# Patient Record
Sex: Male | Born: 1970 | Race: White | Hispanic: No | Smoking: Never smoker
Health system: Southern US, Community
[De-identification: ages and names within clinical notes are randomized; demographics above are authoritative.]

## PROBLEM LIST (undated history)

## (undated) DIAGNOSIS — I10 Essential (primary) hypertension: Secondary | ICD-10-CM

## (undated) DIAGNOSIS — E669 Obesity, unspecified: Secondary | ICD-10-CM

## (undated) DIAGNOSIS — R7989 Other specified abnormal findings of blood chemistry: Secondary | ICD-10-CM

## (undated) HISTORY — PX: KNEE SURGERY: SHX244

---

## 2015-02-22 ENCOUNTER — Emergency Department
Admission: EM | Admit: 2015-02-22 | Discharge: 2015-02-22 | Disposition: A | Discharge status: Home / Self Care | Payer: PRIVATE HEALTH INSURANCE | Source: Home / Self Care | Attending: Family Medicine | Admitting: Family Medicine

## 2015-02-22 ENCOUNTER — Encounter: Payer: Self-pay | Admitting: Emergency Medicine

## 2015-02-22 DIAGNOSIS — K649 Unspecified hemorrhoids: Secondary | ICD-10-CM | POA: Diagnosis not present

## 2015-02-22 HISTORY — DX: Other specified abnormal findings of blood chemistry: R79.89

## 2015-02-22 HISTORY — DX: Obesity, unspecified: E66.9

## 2015-02-22 HISTORY — DX: Essential (primary) hypertension: I10

## 2015-02-22 MED ORDER — BENZOCAINE (TOPICAL) 10 % EX OINT: TOPICAL_OINTMENT | CUTANEOUS | Status: AC

## 2015-02-22 MED ORDER — PREPARATION H 50 % EX PADS: MEDICATED_PAD | CUTANEOUS | Status: AC

## 2015-02-22 NOTE — ED Provider Notes (Signed)
CSN: [id number]     Arrival date & time 02/22/15  9147 History   First MD Initiated Contact with Patient 02/22/15 [phone]     Chief Complaint  Patient presents with  . Hemorrhoids   (Consider location/radiation/quality/duration/timing/severity/associated sxs/prior Treatment) HPI Pt is a 44yo male presenting to [facility] with c/o worsening hemorrhoids for 6 days. He has been using preparation H and increasing his daily fiber to help with bowel movements but no relief.  He denies fever, chills, n/v/d. Denies difficulty having a bowel movement until yesterday he states he did have mild constipation. Denies urinary symptoms. Denies abdominal pain. Denies prior hx of hemorrhoids. He does have a PCP.   Past Medical History  Diagnosis Date  . Hypertension   . Obesity   . Low testosterone    Past Surgical History  Procedure Laterality Date  . Knee surgery     Family History  Problem Relation Age of Onset  . Hypertension Mother    Social History  Substance Use Topics  . Smoking status: Never Smoker   . Smokeless tobacco: None  . Alcohol Use: No    Review of Systems  Constitutional: Negative for fever and chills.  Gastrointestinal: Positive for constipation, anal bleeding and rectal pain. Negative for nausea, vomiting, abdominal pain, diarrhea and blood in stool.  Genitourinary: Negative for dysuria, frequency, hematuria and flank pain.  Musculoskeletal: Negative for myalgias and arthralgias.  Skin: Positive for wound. Negative for color change.    Allergies  Review of patient's allergies indicates not on file.  Home Medications   Prior to Admission medications   Medication Sig Start Date End Date Taking? Authorizing Provider  amLODipine-benazepril (LOTREL) 10-20 MG capsule Take 1 capsule by mouth daily.   Yes Historical Provider, MD  lisinopril-hydrochlorothiazide (PRINZIDE,ZESTORETIC) 10-12.5 MG tablet Take 1 tablet by mouth daily.   Yes Historical Provider, MD  nebivolol (BYSTOLIC)  10 MG tablet Take 10 mg by mouth daily.   Yes Historical Provider, MD  testosterone (ANDROGEL) 50 MG/5GM (1%) GEL Place 5 g onto the skin daily.   Yes Historical Provider, MD  BENZOCAINE, TOPICAL, 10 % OINT Apply up to 6 times daily to help with pain, discomfort 02/22/15   Junius Finner, PA-C  Witch Hazel (PREPARATION H) 50 % PADS Use up to 6 times a day or after each bowel movement as needed 02/22/15   Junius Finner, PA-C   Meds Ordered and Administered this Visit  Medications - No data to display  BP 154/87 mmHg  Pulse 67  Temp(Src) 97.6 F (36.4 C) (Oral)  Wt 480 lb (217.727 kg)  SpO2 95% No data found.   Physical Exam  Constitutional: He is oriented to person, place, and time. He appears well-developed and well-nourished.  HENT:  Head: Normocephalic and atraumatic.  Eyes: EOM are normal.  Neck: Normal range of motion.  Cardiovascular: Normal rate.   Pulmonary/Chest: Effort normal.  Genitourinary: Rectal exam shows external hemorrhoid and tenderness. Rectal exam shows anal tone normal.     Chaperoned exam. External hemorrhoids- one 1cm, scant red blood. One 3cm hemorrhoid, no erythema or discoloration of skin. Tender to touch.  Well healed scar, non-tender. C/w prior pilonidal cyst. No erythema or fluctuance.  Musculoskeletal: Normal range of motion.  Neurological: He is alert and oriented to person, place, and time.  Skin: Skin is warm and dry.  Psychiatric: He has a normal mood and affect. His behavior is normal.  Nursing note and vitals reviewed.   ED Course  Procedures (  including critical care time)  Labs Review Labs Reviewed - No data to display  Imaging Review No results found.    MDM   1. Hemorrhoids, unspecified hemorrhoid type     Pt is a 44yo male presenting to [facility] with c/o worsening external hemorrhoids.  Pain with bowel movements, scant red blood on stool but no other symptoms. Exam c/w large external hemorrhoids w/o evidence of underlying  cellulitis as this time. No evidence of pilonidal abscess. Due to size of hemorrhoids, advised pt he would likely need a general surgery consultation for further treatment options.  Encouraged to continue conservative treatment in the meantime. Rx: witch hazel pads and benzocaine topical ointment.  [facility] but also advised pt, referral to a general surgeon may need to come from his PCP. Patient verbalized understanding and agreement with treatment plan.    Junius Finnerrin O'Malley, PA-C 02/22/15 1017

## 2015-02-22 NOTE — Discharge Instructions (Signed)
You may continue to use over the counter fiber supplements such as Metamucil, Citrucel, or Benefiber to help with having easier bowel movements.  See below for further instructions.   Disposable Sitz Bath A disposable sitz bath is a plastic basin that fits over the toilet. A bag is hung above the toilet and is connected to a tube that opens into the disposable sitz bath. The bag is filled with warm water that can flow into the basin through the tube.  HOW TO USE A DISPOSABLE SITZ BATH  Close the clamp on the tubing before filling the bag with water. This is to prevent leakage.  Fill the sitz bath basin and the plastic bag with warm water.  Place the filled basin on the toilet with the seat raised. Make sure the overflow opening is facing toward the back of the toilet.  Hang the filled plastic bag overhead on a hook or towel rack close to the toilet. When the bag is unclamped, a steady stream of water will flow from the bag, through the tubing, and into the basin.  Attach the tubing to the opening on the basin.  Sit on the basin positioned on the toilet seat and release the clamp. This will allow warm water to flush the area around your genitals and anus (perineum).  Remain sitting on the basin for approximately 15 to 20 minutes.  Stand up and pat the perineum area dry. If needed, apply clean bandages (dressings) to the affected area.  Tip the basin into the toilet to remove any remaining water and flush the toilet.  Wash the basin with warm water and soap. Let it dry in the sink.  Store the basin and tubing in a clean, dry area.  Wash your hands with soap and water. SEEK MEDICAL CARE IF: You get worse instead of better. Stop the sitz baths if you get worse. MAKE SURE YOU:  Understand these instructions.  Will watch your condition.  Will get help right away if you are not doing well or get worse.   This information is not intended to replace advice given to you by your health  care provider. Make sure you discuss any questions you have with your health care provider.   Document Released: 09/21/2011 Document Revised: 12/15/2011 Document Reviewed: 09/21/2011 Elsevier Interactive Patient Education 2016 [employer]  Hemorrhoids Hemorrhoids are swollen veins around the rectum or anus. There are two types of hemorrhoids:   Internal hemorrhoids. These occur in the veins just inside the rectum. They may poke through to the outside and become irritated and painful.  External hemorrhoids. These occur in the veins outside the anus and can be felt as a painful swelling or hard lump near the anus. CAUSES  Pregnancy.   Obesity.   Constipation or diarrhea.   Straining to have a bowel movement.   Sitting for long periods on the toilet.  Heavy lifting or other activity that caused you to strain.  Anal intercourse. SYMPTOMS   Pain.   Anal itching or irritation.   Rectal bleeding.   Fecal leakage.   Anal swelling.   One or more lumps around the anus.  DIAGNOSIS  Your caregiver may be able to diagnose hemorrhoids by visual examination. Other examinations or tests that may be performed include:   Examination of the rectal area with a gloved hand (digital rectal exam).   Examination of anal canal using a small tube (scope).   A blood test if you have lost a significant  amount of blood.  A test to look inside the colon (sigmoidoscopy or colonoscopy). TREATMENT Most hemorrhoids can be treated at home. However, if symptoms do not seem to be getting better or if you have a lot of rectal bleeding, your caregiver may perform a procedure to help make the hemorrhoids get smaller or remove them completely. Possible treatments include:   Placing a rubber band at the base of the hemorrhoid to cut off the circulation (rubber band ligation).   Injecting a chemical to shrink the hemorrhoid (sclerotherapy).   Using a tool to burn the hemorrhoid  (infrared light therapy).   Surgically removing the hemorrhoid (hemorrhoidectomy).   Stapling the hemorrhoid to block blood flow to the tissue (hemorrhoid stapling).  HOME CARE INSTRUCTIONS   Eat foods with fiber, such as whole grains, beans, nuts, fruits, and vegetables. Ask your doctor about taking products with added fiber in them (fibersupplements).  Increase fluid intake. Drink enough water and fluids to keep your urine clear or pale yellow.   Exercise regularly.   Go to the bathroom when you have the urge to have a bowel movement. Do not wait.   Avoid straining to have bowel movements.   Keep the anal area dry and clean. Use wet toilet paper or moist towelettes after a bowel movement.   Medicated creams and suppositories may be used or applied as directed.   Only take over-the-counter or prescription medicines as directed by your caregiver.   Take warm sitz baths for 15-20 minutes, 3-4 times a day to ease pain and discomfort.   Place ice packs on the hemorrhoids if they are tender and swollen. Using ice packs between sitz baths may be helpful.   Put ice in a plastic bag.   Place a towel between your skin and the bag.   Leave the ice on for 15-20 minutes, 3-4 times a day.   Do not use a donut-shaped pillow or sit on the toilet for long periods. This increases blood pooling and pain.  SEEK MEDICAL CARE IF:  You have increasing pain and swelling that is not controlled by treatment or medicine.  You have uncontrolled bleeding.  You have difficulty or you are unable to have a bowel movement.  You have pain or inflammation outside the area of the hemorrhoids. MAKE SURE YOU:  Understand these instructions.  Will watch your condition.  Will get help right away if you are not doing well or get worse.   This information is not intended to replace advice given to you by your health care provider. Make sure you discuss any questions you have with your  health care provider.   Document Released: 03/19/2000 Document Revised: 03/08/2012 Document Reviewed: 01/25/2012 Elsevier Interactive Patient Education 2016 Elsevier Inc.  Surgical Procedures for Hemorrhoids Surgical procedures can be used to treat hemorrhoids. Hemorrhoids are swollen veins that are inside the rectum (internal hemorrhoids) or around the anus (external hemorrhoids). They are caused by increased pressure in the anal area. This pressure may result from straining to have a bowel movement (constipation), diarrhea, pregnancy, obesity, anal sex, or sitting for long periods of time. Hemorrhoids can cause symptoms such as pain and bleeding. Surgery may be needed if diet changes, lifestyle changes, and other treatments do not help your symptoms. Various surgical methods may be used. Three common methods are:  Closed hemorrhoidectomy. The hemorrhoids are surgically removed, and the surgical cuts (incisions) are closed with stitches (sutures).  Open hemorrhoidectomy. The hemorrhoids are surgically removed, but the  incisions are allowed to heal without sutures.  Stapled hemorrhoidopexy. The hemorrhoids are removed using a device that takes out a ring of excess tissue. LET [facility] CARE PROVIDER KNOW ABOUT:  Any allergies you have.  All medicines you are taking, including vitamins, herbs, eye drops, creams, and over-the-counter medicines.  Previous problems you or members of your family have had with the use of anesthetics.  Any blood disorders you have.  Previous surgeries you have had.  Any medical conditions you have.  Whether you are pregnant or may be pregnant. RISKS AND COMPLICATIONS Generally, this is a safe procedure. However, problems may occur, including:  Infection.  Bleeding.  Allergic reactions to medicines.  Damage to other structures or organs.  Pain.  Constipation.  Difficulty passing urine.  Narrowing of the anal canal (stenosis).  Difficulty  controlling bowel movements (incontinence). BEFORE THE PROCEDURE  Ask your health care provider about:  Changing or stopping your regular medicines. This is especially important if you are taking diabetes medicines or blood thinners.  Taking medicines such as aspirin and ibuprofen. These medicines can thin your blood. Do not take these medicines before your procedure if your health care provider instructs you not to.  You may need to have a procedure to examine the inside of your colon with a scope (colonoscopy). Your health care provider may do this to make sure that there are no other causes for your bleeding or pain.  Follow instructions from your health care provider about eating or drinking restrictions.  You may be instructed to take a laxative and an enema to clean out your colon before surgery (bowel prep). Carefully follow instructions from your health care provider about bowel prep.  Ask your health care provider how your surgical site will be marked or identified.  You may be given antibiotic medicine to help prevent infection.  Plan to have someone take you home after the procedure. PROCEDURE  To reduce your risk of infection:  Your health care team will wash or sanitize their hands.  Your skin will be washed with soap.  An IV tube will be inserted into one of your veins.  You will be given one or more of the following:  A medicine to help you relax (sedative).  A medicine to numb the area (local anesthetic).  A medicine to make you fall asleep (general anesthetic).  A medicine that is injected into an area of your body to numb everything below the injection site (regional anesthetic).  A lubricating jelly may be placed into your rectum.  Your surgeon will insert a short scope (anoscope) into your rectum to examine the hemorrhoids.  One of the following hemorrhoid procedures will be performed. Closed Hemorrhoidectomy  Your surgeon will use surgical  instruments to open the tissue around the hemorrhoids.  The veins that supply the hemorrhoids will be tied off with a suture.  The hemorrhoids will be removed.  The tissue that surrounds the hemorrhoids will be closed with sutures that your body can absorb (absorbable sutures). Open Hemorrhoidectomy  The hemorrhoids will be removed with surgical instruments.  The incisions will be left open to heal without sutures. Stapled Hemorrhoidopexy  Your surgeon will use a circular stapling device to remove the hemorrhoids.  The device will be inserted into your anus. It will remove a circular ring of tissue that includes hemorrhoid tissue and some tissue above the hemorrhoids.  The staples in the device will close the edges of removed tissue. This will cut  off the blood supply to the hemorrhoids and will pull any remaining hemorrhoids back into place. Each of these procedures may vary among health care providers and hospitals. AFTER THE PROCEDURE  Your blood pressure, heart rate, breathing rate, and blood oxygen level will be monitored often until the medicines you were given have worn off.  You will be given pain medicine as needed.   This information is not intended to replace advice given to you by your health care provider. Make sure you discuss any questions you have with your health care provider.   Document Released: 01/17/2009 Document Revised: 12/11/2014 Document Reviewed: 06/17/2014 Elsevier Interactive Patient Education 2016 Elsevier Inc.  Nonsurgical Procedures for Hemorrhoids Nonsurgical procedures can be used to treat hemorrhoids. Hemorrhoids are swollen veins that are inside the rectum (internal hemorrhoids) or around the anus (external hemorrhoids). They are caused by increased pressure in the anal area. This pressure may result from straining to have a bowel movement (constipation), diarrhea, pregnancy, obesity, anal sex, or sitting for long periods of time. Hemorrhoids can  cause symptoms such as pain and bleeding. Various procedures may be performed if diet changes, lifestyle changes, and other treatments do not help your symptoms. Some of these procedures do not involve surgery. Three common nonsurgical procedures are:  Rubber band ligation. Rubber bands are used to cut off the blood supply to the hemorrhoids.  Sclerotherapy. Medicine is injected into the hemorrhoids to shrink them.  Infrared coagulation. A type of light energy is used to get rid of the hemorrhoids. LET [facility] CARE PROVIDER KNOW ABOUT:  Any allergies you have.  All medicines you are taking, including vitamins, herbs, eye drops, creams, and over-the-counter medicines.  Previous problems you or members of your family have had with the use of anesthetics.  Any blood disorders you have.  Previous surgeries you have had.  Any medical conditions you have.  Whether you are pregnant or may be pregnant. RISKS AND COMPLICATIONS Generally, this is a safe procedure. However, problems may occur, including:  Infection.  Bleeding.  Pain. BEFORE THE PROCEDURE  Ask your health care provider about:  Changing or stopping your regular medicines. This is especially important if you are taking diabetes medicines or blood thinners.  Taking medicines such as aspirin and ibuprofen. These medicines can thin your blood. Do not take these medicines before your procedure if your health care provider instructs you not to.  You may need to have a procedure to examine the inside of your colon with a scope (colonoscopy). Your health care provider may do this to make sure that there are no other causes for your bleeding or pain. PROCEDURE  Your health care provider will clean your rectal area with a rinsing solution.  A lubricating jelly may be placed into your rectum. The jelly may contain a medicine to numb the area (local anesthetic).  Your health care provider will insert a short scope (anoscope)  into your rectum to examine the hemorrhoids.  One of the following techniques will be used. Rubber Band Ligation Your health care provider will place medical instruments through the scope to put rubber bands around the base of your hemorrhoids. The bands will cut off the blood supply to the hemorrhoids. The hemorrhoids will fall off after several days. Sclerotherapy Your health care provider will inject medicine through the scope into your hemorrhoids. This will cause them to shrink and dry up. Infrared Coagulation Your health care provider will shine a type of light through the scope  onto your hemorrhoids. This light will generate energy (infrared radiation). It will cause the hemorrhoids to scar and then fall off. Each of these procedures may vary among health care providers and hospitals. AFTER THE PROCEDURE  You will be monitored to make sure that you have no bleeding.  Return to your normal activities as told by your health care provider.   This information is not intended to replace advice given to you by your health care provider. Make sure you discuss any questions you have with your health care provider.   Document Released: 01/17/2009 Document Revised: 12/11/2014 Document Reviewed: 06/17/2014 Elsevier Interactive Patient Education [employer]

## 2015-02-22 NOTE — ED Notes (Signed)
Pt c/o hemorrhoids x6 days. States he has been using prep H with no improvement. This is a new problem for him.

## 2016-07-09 ENCOUNTER — Emergency Department
Admission: EM | Admit: 2016-07-09 | Discharge: 2016-07-09 | Disposition: A | Discharge status: Home / Self Care | Payer: 59 | Source: Home / Self Care | Attending: Family Medicine | Admitting: Family Medicine

## 2016-07-09 ENCOUNTER — Emergency Department (INDEPENDENT_AMBULATORY_CARE_PROVIDER_SITE_OTHER): Payer: 59

## 2016-07-09 ENCOUNTER — Encounter: Payer: Self-pay | Admitting: Emergency Medicine

## 2016-07-09 DIAGNOSIS — M62838 Other muscle spasm: Secondary | ICD-10-CM

## 2016-07-09 DIAGNOSIS — M25511 Pain in right shoulder: Secondary | ICD-10-CM

## 2016-07-09 MED ORDER — METHYLPREDNISOLONE ACETATE 40 MG/ML IJ SUSP
80.0000 mg | Freq: Once | INTRAMUSCULAR | Status: AC
  Administered 2016-07-09: 80 mg via INTRAMUSCULAR

## 2016-07-09 MED ORDER — CYCLOBENZAPRINE HCL 5 MG PO TABS: 5.0000 mg | ORAL_TABLET | Freq: Two times a day (BID) | ORAL | 0 refills | Status: AC | PRN

## 2016-07-09 MED ORDER — PREDNISONE 20 MG PO TABS: ORAL_TABLET | ORAL | 0 refills | Status: AC

## 2016-07-09 NOTE — Discharge Instructions (Signed)
°  You may have  acetaminophen (Tylenol) every 4-6 hours or up to  (1g) for the first dose.  You may also take 400-600mg  ibuprofen every 6-8 hours for pain.  This may be taken at the same time as the acetaminophen and flexeril if you prefer.   Flexeril (cyclobenzaprine) is a muscle relaxer and may cause drowsiness. Do not drink alcohol, drive, or operate heavy machinery while taking.  It is best to start with a lower dose to help prevent prolonged drowsiness.

## 2016-07-09 NOTE — ED Provider Notes (Signed)
CSN: [id number]     Arrival date & time 07/09/16  1218 History   First MD Initiated Contact with Patient 07/09/16 1254     Chief Complaint  Patient presents with  . Shoulder Pain    right   (Consider location/radiation/quality/duration/timing/severity/associated sxs/prior Treatment) HPI Ronnie Todd is a 46 y.o. 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  Today, arm has become tighter and more painful, difficulty moving it. Denies known injury/  He took  acetaminophen this morning.  Hx of similar pain in the past but he notes that was from an overuse injury and he still had good ROM with his arm. He is Right hand dominant. He is currently staying with his girlfriend in the area but is going out of town for work on Sunday, 07/11/16 and hopes pain improves by then.    Past Medical History:  Diagnosis Date  . Hypertension   . Low testosterone   . Obesity    Past Surgical History:  Procedure Laterality Date  . KNEE SURGERY     Family History  Problem Relation Age of Onset  . Hypertension Mother    Social History  Substance Use Topics  . Smoking status: Never Smoker  . Smokeless tobacco: Not on file  . Alcohol use No    Review of Systems  Musculoskeletal: Positive for arthralgias, back pain (Right upper) and myalgias. Negative for gait problem, joint swelling, neck pain and neck stiffness.       Right shoulder  Skin: Negative for color change and rash.  Neurological: Positive for weakness. Negative for numbness.    Allergies  Patient has no known allergies.  Home Medications   Prior to Admission medications   Medication Sig Start Date End Date Taking? Authorizing Provider  amLODipine-benazepril (LOTREL) 10-20 MG capsule Take 1 capsule by mouth daily.    Historical Provider, MD  BENZOCAINE, TOPICAL, 10 % OINT Apply up to 6 times daily to help with pain, discomfort  02/22/15   Junius Finner, PA-C  cyclobenzaprine (FLEXERIL) 5 MG tablet Take 1-2 tablets (5-10 mg total) by mouth 2 (two) times daily as needed for muscle spasms. 07/09/16   Junius Finner, PA-C  lisinopril-hydrochlorothiazide (PRINZIDE,ZESTORETIC) 10-12.5 MG tablet Take 1 tablet by mouth daily.    Historical Provider, MD  nebivolol (BYSTOLIC) 10 MG tablet Take 10 mg by mouth daily.    Historical Provider, MD  predniSONE (DELTASONE) 20 MG tablet 3 tabs po day one, then 2 po daily x 4 days 07/09/16   Junius Finner, PA-C  testosterone (ANDROGEL) 50 MG/5GM (1%) GEL Place 5 g onto the skin daily.    Historical Provider, MD  Witch Hazel (PREPARATION H) 50 % PADS Use up to 6 times a day or after each bowel movement as needed 02/22/15   Junius Finner, PA-C   Meds Ordered and Administered this Visit   Medications  methylPREDNISolone acetate (DEPO-MEDROL) injection 80 mg (80 mg Intramuscular Given 07/09/16 1333)    BP (!) 174/88 (BP Location: Left Arm)   Pulse 77   Temp 98 F (36.7 C) (Oral)   Resp 18   Ht  (1.88 m)   Wt (!) 500 lb (226.8 kg)   SpO2 95%   BMI 64.20 kg/m  No data found.   Physical Exam  Constitutional: He is oriented to person, place, and time. He appears well-developed and well-nourished. No distress.  HENT:  Head:  Normocephalic and atraumatic.  Eyes: EOM are normal.  Neck: Normal range of motion. Neck supple.  No midline bone tenderness, no crepitus or step-offs. No tenderness to cervical muscles.  Cardiovascular: Normal rate.   Pulses:      Radial pulses are 2+ on the right side.  Pulmonary/Chest: Effort normal. No stridor.  Musculoskeletal: He exhibits tenderness. He exhibits no edema.  Right shoulder: no obvious deformity. Tenderness to posterior and anterior aspect of joint. Worse in posterior aspect and Right upper trapezius.  Limited ROM Right shoulder.  Full ROM elbow with minimal tenderness.  Lymphadenopathy:    He has no cervical adenopathy.  Neurological:  He is alert and oriented to person, place, and time.  Skin: Skin is warm and dry. Capillary refill takes less than 2 seconds. He is not diaphoretic.  Psychiatric: He has a normal mood and affect. His behavior is normal.  Nursing note and vitals reviewed.   Urgent Care Course     Procedures (including critical care time)  Labs Review Labs Reviewed - No data to display  Imaging Review Dg Shoulder Right  Result Date: 07/09/2016 CLINICAL DATA:  Right shoulder pain for 1 day, no known injury EXAM: RIGHT SHOULDER - 2+ VIEW COMPARISON:  None. FINDINGS: Three views of the right shoulder submitted. Intramedullary fixation rod and metallic fixation screw is noted in right humerus. Glenohumeral joint is preserved. Mild degenerative changes acromioclavicular joint. IMPRESSION: No acute fracture or subluxation. Postsurgical changes with intramedullary rod right humerus. There is anatomic alignment. Mild degenerative changes right AC joint. Electronically Signed   By: Natasha Mead M.D.   On: 07/09/2016 13:14    MDM   1. Acute pain of right shoulder   2. Muscle spasm of right shoulder    Pt c/o Right shoulder pain and stiffness. Pt woke with the pain.  Plain films: mild degenerative changes   Depomedrol  IM   Rx: flexeril and prednisone Encouraged to alternate acetaminophen and ibuprofen for pain. Home care instructions provided including ROM exercises f/u with Sports Medicine in 1 week if not improving.     Junius Finner, PA-C 07/09/16 1436

## 2016-07-09 NOTE — ED Triage Notes (Signed)
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 He took tylenol  pta.

## 2016-07-11 ENCOUNTER — Telehealth: Payer: Self-pay | Admitting: Emergency Medicine

## 2016-07-11 NOTE — Telephone Encounter (Signed)
Spoke with patient and he advised that the pain is improving, and he is doing much better. No further needed at this time.

## 2018-05-01 IMAGING — DX DG SHOULDER 2+V*R*
3 series · 3 of 3 positions shown · non-contrast
Comparison: None.

CLINICAL DATA: Right shoulder pain for 1 day, no known injury

EXAM:
RIGHT SHOULDER - 2+ VIEW

[shoulder grashey]
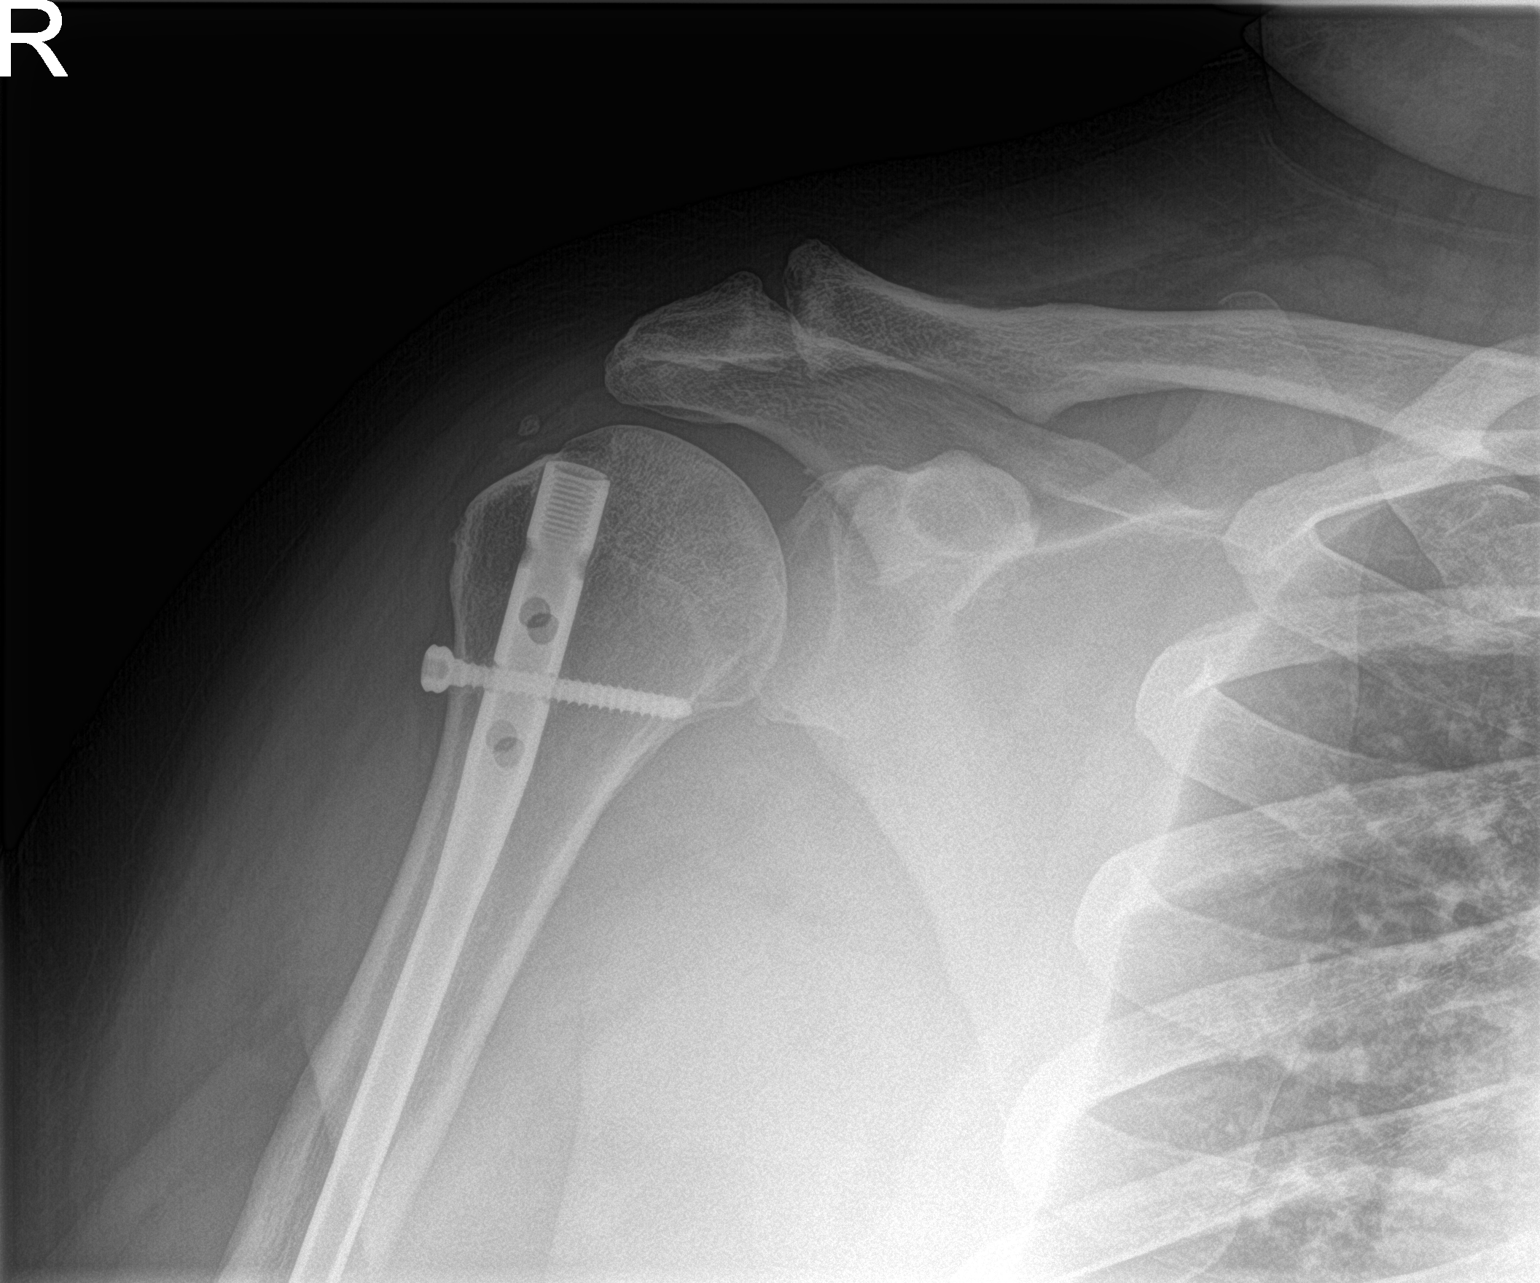

[shoulder y view]
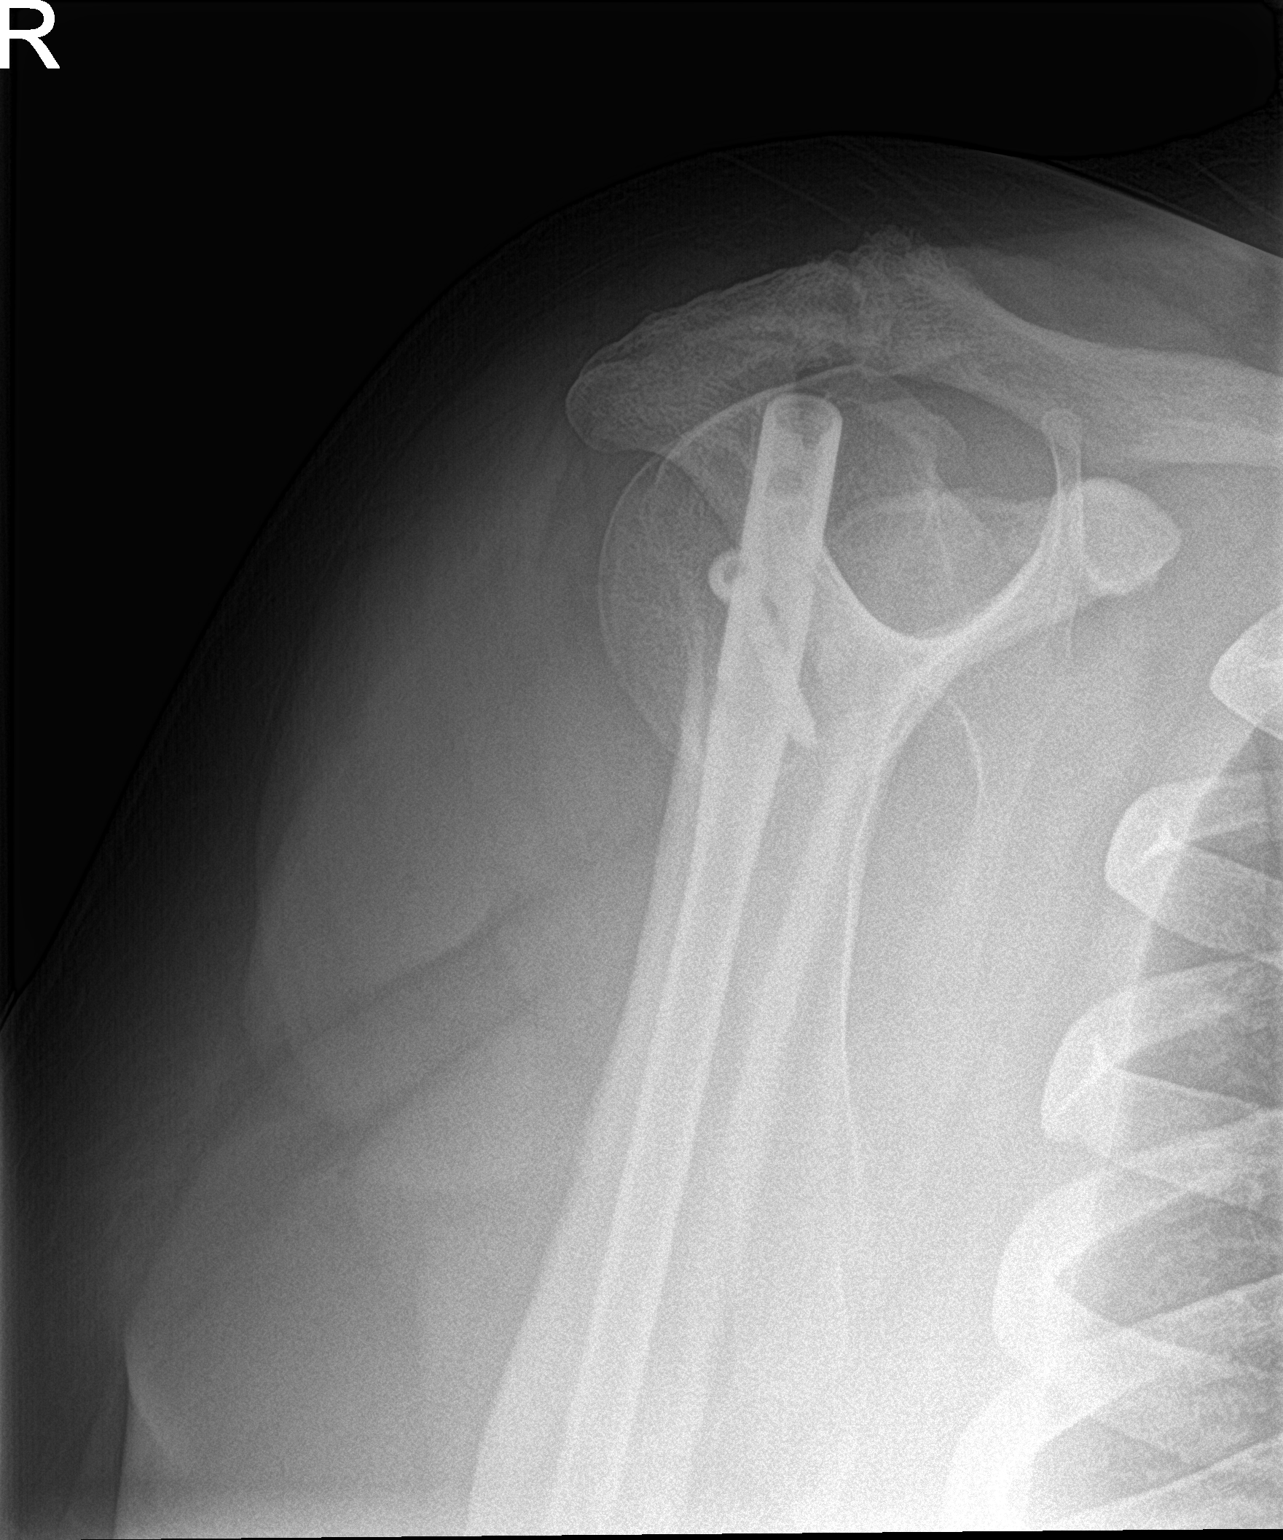

[shoulder axillary]
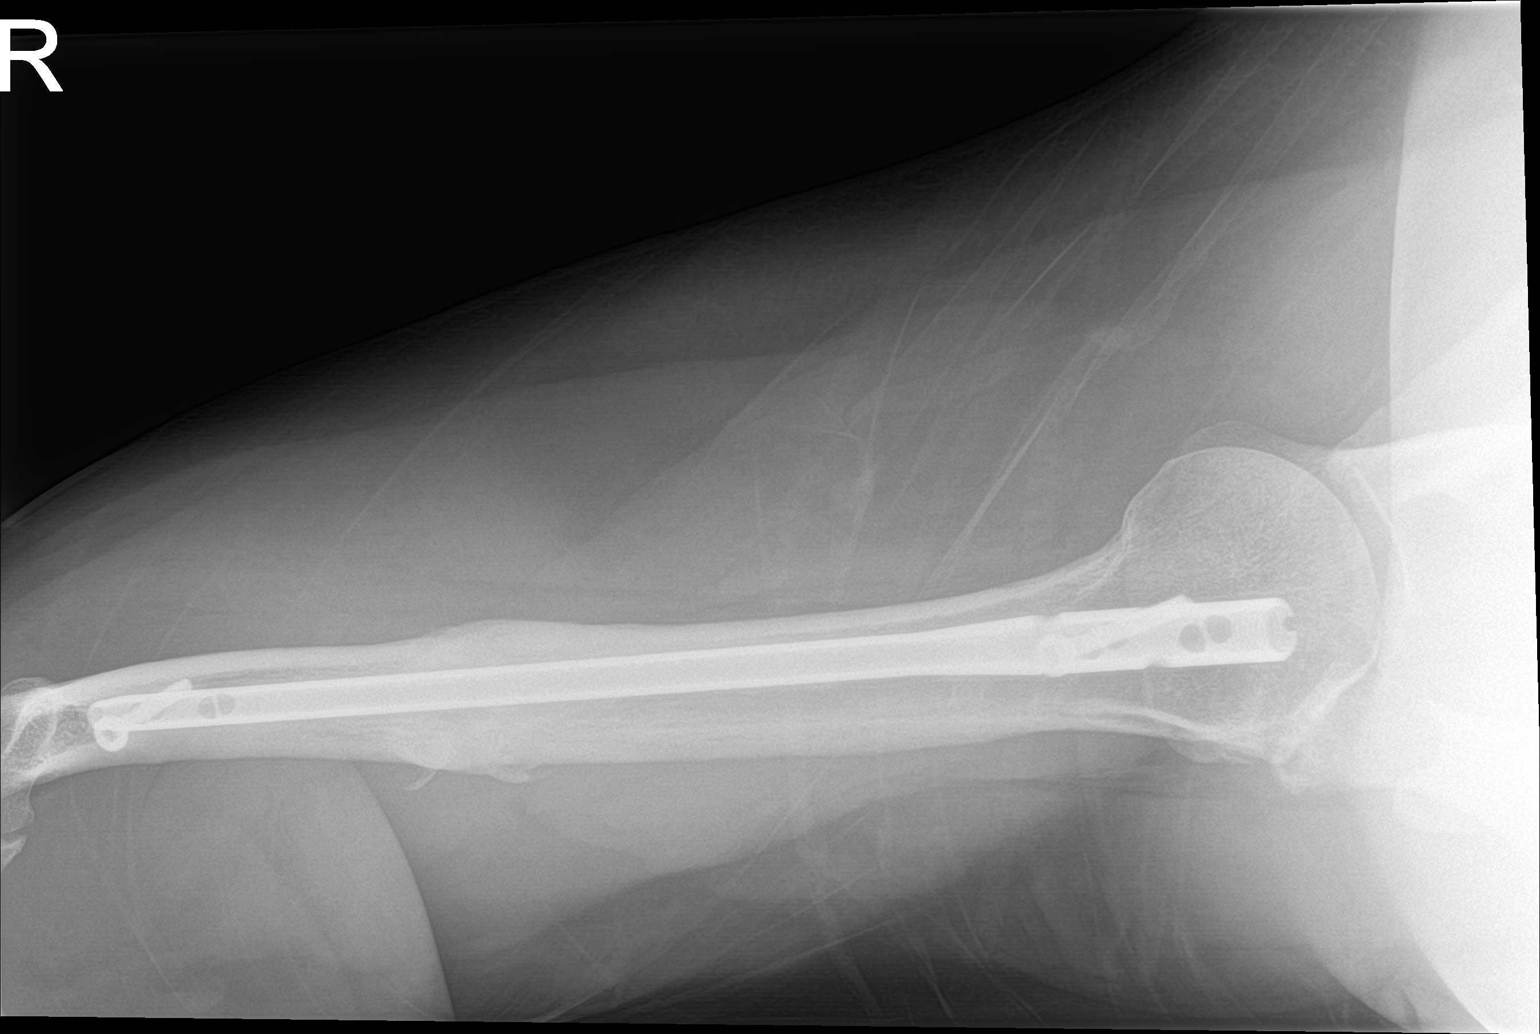

[3 of 3 positions shown; findings below may reference images not displayed]

FINDINGS: Three views of the right shoulder submitted. Intramedullary fixation
rod and metallic fixation screw is noted in right humerus.
Glenohumeral joint is preserved. Mild degenerative changes
acromioclavicular joint.
IMPRESSION: No acute fracture or subluxation. Postsurgical changes with
intramedullary rod right humerus. There is anatomic alignment. Mild
degenerative changes right AC joint.
